# Patient Record
Sex: Male | Born: 2000 | Hispanic: No | Marital: Single
Health system: Southern US, Community
[De-identification: ages and names within clinical notes are randomized; demographics above are authoritative.]

---
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
## 2020-04-07 ENCOUNTER — Emergency Department (HOSPITAL_COMMUNITY)
Admission: EM | Admit: 2020-04-07 | Discharge: 2020-04-07 | Disposition: A | Discharge status: Home / Self Care | Payer: Medicaid Other | Attending: Emergency Medicine | Admitting: Emergency Medicine

## 2020-04-07 ENCOUNTER — Other Ambulatory Visit: Payer: Self-pay

## 2020-04-07 ENCOUNTER — Encounter (HOSPITAL_COMMUNITY): Payer: Self-pay | Admitting: Emergency Medicine

## 2020-04-07 ENCOUNTER — Emergency Department (HOSPITAL_COMMUNITY): Payer: Medicaid Other

## 2020-04-07 DIAGNOSIS — Y999 Unspecified external cause status: Secondary | ICD-10-CM | POA: Insufficient documentation

## 2020-04-07 DIAGNOSIS — W25XXXA Contact with sharp glass, initial encounter: Secondary | ICD-10-CM | POA: Insufficient documentation

## 2020-04-07 DIAGNOSIS — S91125A Laceration with foreign body of left lesser toe(s) without damage to nail, initial encounter: Secondary | ICD-10-CM

## 2020-04-07 DIAGNOSIS — Y929 Unspecified place or not applicable: Secondary | ICD-10-CM | POA: Insufficient documentation

## 2020-04-07 DIAGNOSIS — S91115A Laceration without foreign body of left lesser toe(s) without damage to nail, initial encounter: Secondary | ICD-10-CM | POA: Diagnosis not present

## 2020-04-07 DIAGNOSIS — Y939 Activity, unspecified: Secondary | ICD-10-CM | POA: Insufficient documentation

## 2020-04-07 DIAGNOSIS — S91145A Puncture wound with foreign body of left lesser toe(s) without damage to nail, initial encounter: Secondary | ICD-10-CM | POA: Diagnosis present

## 2020-04-07 DIAGNOSIS — Z79899 Other long term (current) drug therapy: Secondary | ICD-10-CM | POA: Diagnosis not present

## 2020-04-07 MED ORDER — CEPHALEXIN 500 MG PO CAPS: 500.0000 mg | ORAL_CAPSULE | Freq: Four times a day (QID) | ORAL | 0 refills | Status: AC

## 2020-04-07 NOTE — ED Triage Notes (Signed)
Per pt, states he cut his left little toe about 2 weeks ago-sates he has been cleaning it etc-came toED to have it looked at

## 2020-04-07 NOTE — ED Provider Notes (Signed)
[facility]-EMERGENCY DEPT Provider Note   CSN: [id number] Arrival date & time: 04/07/20  1231     History Chief Complaint  Patient presents with  . Extremity Laceration    Derek Nunez is a 19 y.o. male.  HPI Patient is a 19 year old male with no pertinent medical history who presents to the emergency department complaining of a laceration to his left fifth toe that occurred about 2 weeks ago.  Patient states he was outside and believes he cut it on a shard of glass.  He has been cleaning the wound and applying bandaging and came to the emergency department today for evaluation.  Notes mild pain overlying the site but otherwise denies any discharge, increased warmth, fevers, chills, nausea, vomiting.    History reviewed. No pertinent past medical history.  There are no problems to display for this patient.   History reviewed. No pertinent surgical history.     No family history on file.  Social History   Tobacco Use  . Smoking status: Not on file  Substance Use Topics  . Alcohol use: Not Currently  . Drug use: Not Currently    Home Medications Prior to Admission medications   Medication Sig Start Date End Date Taking? Authorizing Provider  HYDROcodone-acetaminophen (HYCET) 7.5-325 mg/15 ml solution 2.5 mls po q4-6h prn pain 12/17/12   Viviano Simas, NP  ibuprofen (ADVIL,MOTRIN) 200 MG tablet Take 200 mg by mouth every 6 (six) hours as needed for pain, fever or headache.    [provider]    Allergies    Patient has no known allergies.  Review of Systems   Review of Systems  Constitutional: Negative for chills and fever.  Gastrointestinal: Negative for nausea and vomiting.  Musculoskeletal: Positive for joint swelling and myalgias.  Skin: Positive for wound.   Physical Exam Updated Vital Signs BP 104/77 (BP Location: Left Arm)   Pulse (!) 111   Temp 99.2 F (37.3 C) (Oral)   Resp 16   Ht 5\' 10"  (1.778 m)   Wt 65.8 kg    SpO2 100%   BMI 20.81 kg/m   Physical Exam Vitals and nursing note reviewed.  Constitutional:      General: He is not in acute distress.    Appearance: Normal appearance. He is not ill-appearing, toxic-appearing or diaphoretic.  HENT:     Head: Normocephalic and atraumatic.     Right Ear: External ear normal.     Left Ear: External ear normal.     Nose: Nose normal.     Mouth/Throat:     Mouth: Mucous membranes are moist.     Pharynx: Oropharynx is clear. No oropharyngeal exudate or posterior oropharyngeal erythema.  Eyes:     Extraocular Movements: Extraocular movements intact.  Cardiovascular:     Rate and Rhythm: Normal rate and regular rhythm.     Pulses: Normal pulses.     Heart sounds: Normal heart sounds. No murmur heard.  No friction rub. No gallop.      Comments: RRR. Pt is not tachycardic on my exam. No M/R/G.  Pulmonary:     Effort: Pulmonary effort is normal. No respiratory distress.     Breath sounds: Normal breath sounds. No stridor. No wheezing, rhonchi or rales.  Abdominal:     General: Abdomen is flat.     Tenderness: There is no abdominal tenderness.  Musculoskeletal:        General: Normal range of motion.     Cervical back: Normal  range of motion and neck supple. No tenderness.  Skin:    General: Skin is warm and dry.     Comments: 2 to 3 cm well-healing laceration noted to the left fifth toe.  Patient is able to flex and extend the toe.  Distal sensation intact.  2+ pedal pulses noted bilaterally.  No signs of erythema, fluctuance, induration.  No discharge noted with manipulation of the site.  Neurological:     General: No focal deficit present.     Mental Status: He is alert and oriented to person, place, and time.  Psychiatric:        Mood and Affect: Mood normal.        Behavior: Behavior normal.    ED Results / Procedures / Treatments   Labs (all labs ordered are listed, but only abnormal results are displayed) Labs Reviewed - No data to  display  EKG None  Radiology DG Toe 5th Left  Result Date: 04/07/2020 CLINICAL DATA:  19 year old male status post fall 2 weeks ago with nonhealing left 5th toe. EXAM: DG TOE 5TH LEFT COMPARISON:  None. FINDINGS: Bone mineralization is within normal limits. Underlying osseous structures appear intact and aligned. Normal joint spaces in the left 5th toe. There are 2 vague radiopaque densities on the initial image lateral to the 5th middle phalanx (perhaps skin irregularity), and the second near the lateral nail bed. Of these, the latter persists on all images, and measures 1-2 mm. IMPRESSION: 1. Evidence of a tiny 1-2 mm retained radiopaque foreign body along the lateral tip of the 5th toe near the nail bed (arrows). 2. No underlying osseous abnormality. Electronically Signed   By: Odessa Fleming M.D.   On: 04/07/2020 13:51    Procedures Procedures (including critical care time)  Medications Ordered in ED Medications - No data to display  ED Course  I have reviewed the triage vital signs and the nursing notes.  Pertinent labs & imaging results that were available during my care of the patient were reviewed by me and considered in my medical decision making (see chart for details).  Clinical Course as of Apr 07 1434  Thu Apr 07, 2020  1431 1. Evidence of a tiny 1-2 mm retained radiopaque foreign body along the lateral tip of the 5th toe near the nail bed (arrows). 2. No underlying osseous abnormality.  DG Toe 5th Left [LJ]    Clinical Course User Index [LJ] Placido Sou, PA-C   MDM Rules/Calculators/A&P                          Pt is a 19 y.o. male that presents with a history, physical exam, and ED Clinical Course as noted above.   Patient presents today for follow-up regarding a laceration to his left fifth toe that occurred 2 weeks ago.  Physical exam is extremely reassuring.  No signs or symptoms of infection at this time.  Recommended updating the patient's Tdap but he declined at  this visit. Discussed this with pt but he does not want to move forward at this time.   Given the nature of the cut, will discharge on a short course of Keflex to help prevent infection.  There is a 1 to 2 mm retained foreign body but with it being 2 weeks since the laceration and his toe healing well with minimal tenderness, I am not going to incise the toe at this time.  Will give follow-up with orthopedic  surgery.  Patient is hemodynamically stable and in NAD at the time of d/c. Evaluation does not show pathology that would require ongoing emergent intervention or inpatient treatment. I explained the diagnosis to the patient. Patient is comfortable with above plan and is stable for discharge at this time. All questions were answered prior to disposition. Strict return precautions for returning to the ED were discussed. Encouraged follow up with PCP.    An After Visit Summary was printed and given to the patient.  Patient discharged to home/self care.  Condition at discharge: Stable  Note: Portions of this report may have been transcribed using voice recognition software. Every effort was made to ensure accuracy; however, inadvertent computerized transcription errors may be present.   Final Clinical Impression(s) / ED Diagnoses Final diagnoses:  Laceration of lesser toe of left foot with foreign body without damage to nail, initial encounter    Rx / DC Orders ED Discharge Orders         Ordered    cephALEXin (KEFLEX) 500 MG capsule  4 times daily        04/07/20 1439           Placido Sou, PA-C 04/07/20 1441    Tilden Fossa, MD 04/07/20 1730

## 2020-04-07 NOTE — Discharge Instructions (Signed)
I am prescribing you an antibiotic called Keflex.  You are going to take this 4 times a day for the next 5 days.  Please do not stop taking this early.  I would recommend taking Tylenol and ibuprofen for management of your pain.  Please follow-up with the orthopedist, whose contact information is below.  They can further evaluate your toe and determine if the foreign body in your toe needs to be removed.  You can always return to the emergency department with new or worsening symptoms.  It was a pleasure to meet you.

## 2021-10-29 IMAGING — CR DG TOE 5TH 2+V*L*
4 series · 4 of 4 positions shown · non-contrast
Comparison: None.

CLINICAL DATA: 19-year-old male status post fall 2 weeks ago with
nonhealing left 5th toe.

EXAM:
DG TOE 5TH LEFT

[x toes ap left]
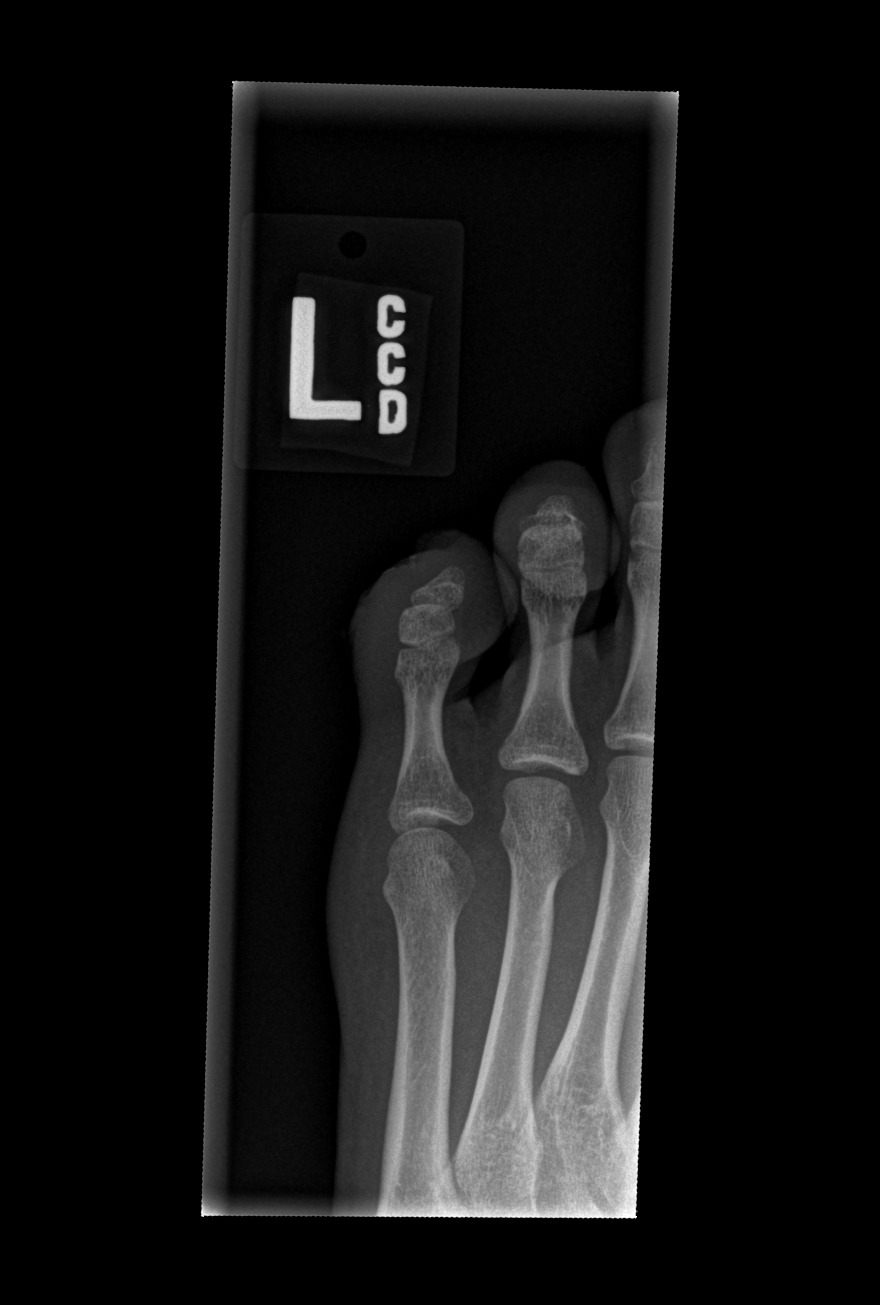

[x toes obl left (1 of 2)]
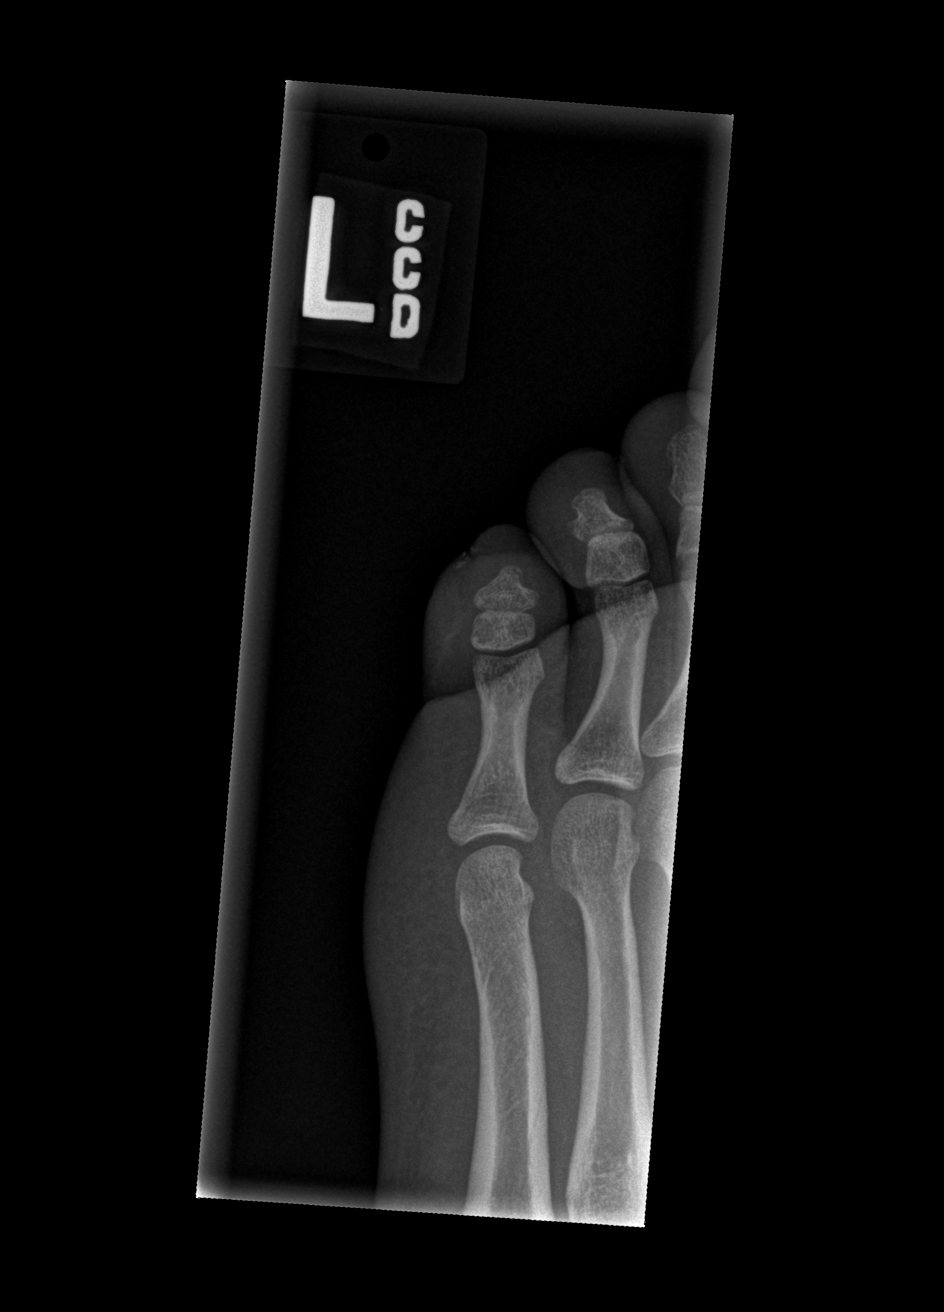

[x toes obl left (2 of 2)]
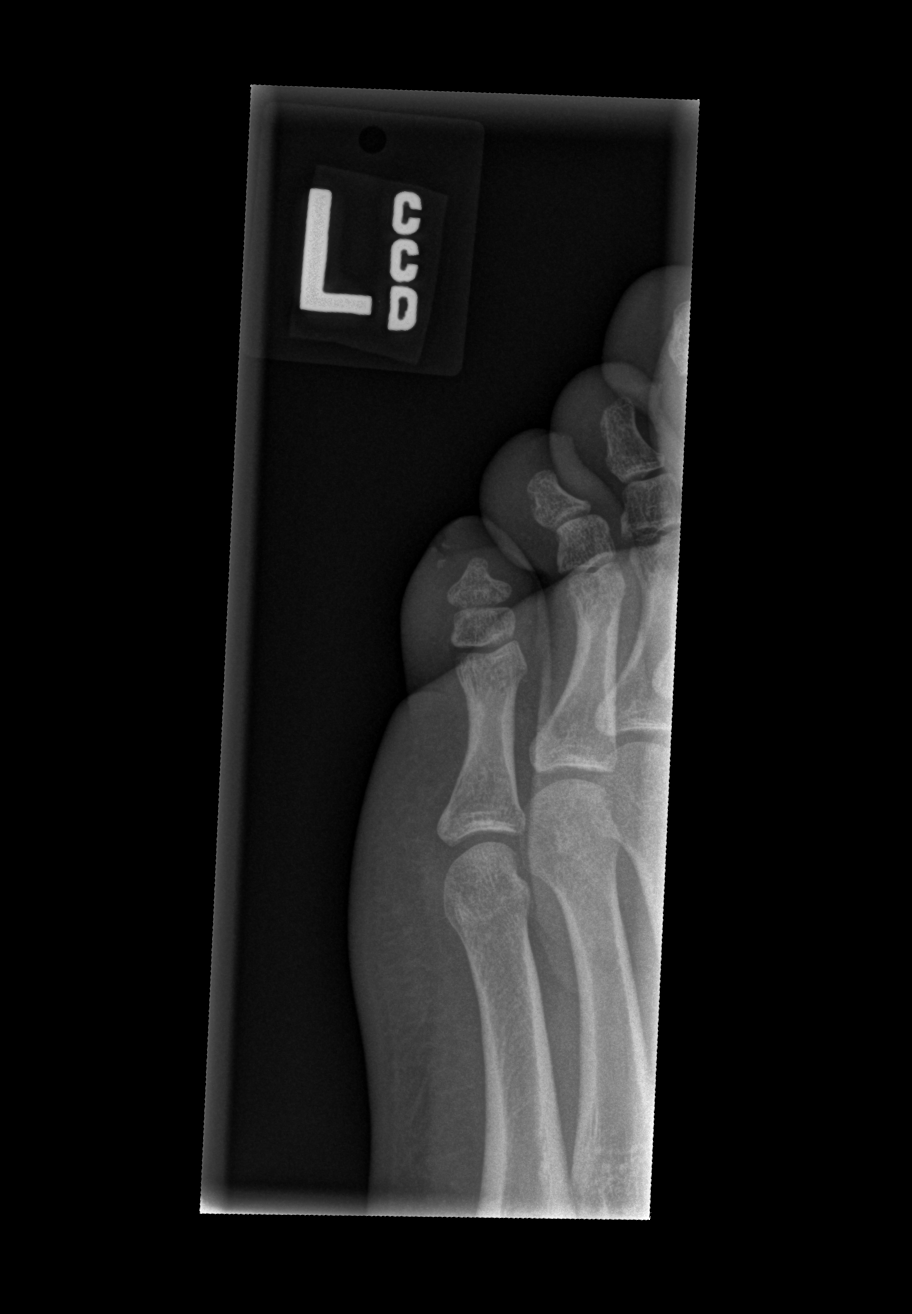

[x toes lat left]
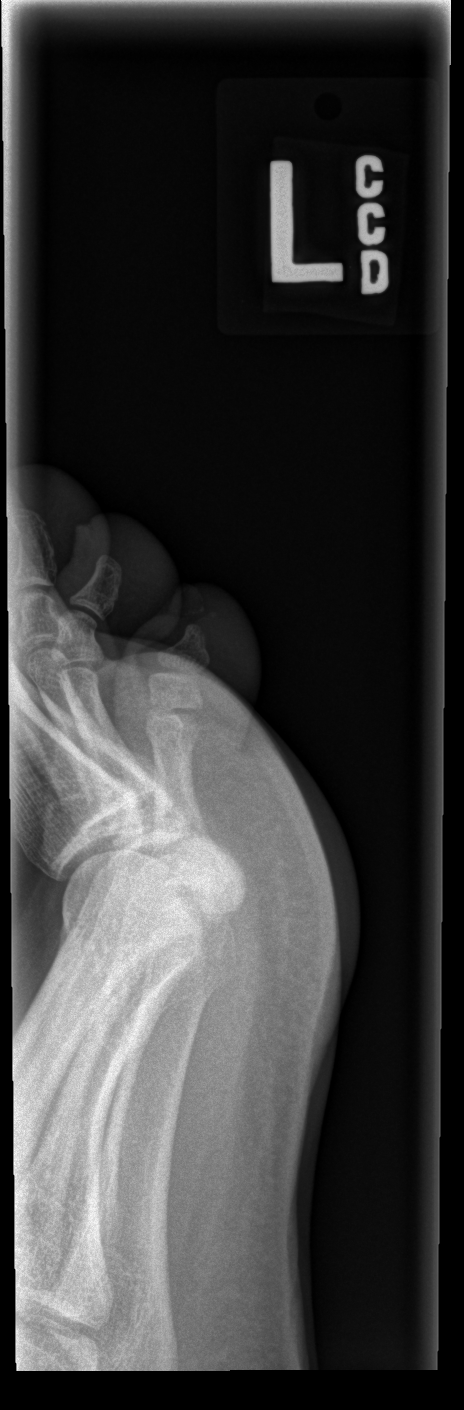

[4 of 4 positions shown; findings below may reference images not displayed]

FINDINGS: Bone mineralization is within normal limits. Underlying osseous
structures appear intact and aligned. Normal joint spaces in the
left 5th toe. There are 2 vague radiopaque densities on the initial
image lateral to the 5th middle phalanx (perhaps skin irregularity),
and the second near the lateral nail bed. Of these, the latter
persists on all images, and measures 1-2 mm.
IMPRESSION: 1. Evidence of a tiny 1-2 mm retained radiopaque foreign body along
the lateral tip of the 5th toe near the nail bed (arrows).
2. No underlying osseous abnormality.

## 2022-01-13 ENCOUNTER — Emergency Department (HOSPITAL_COMMUNITY)
Admission: EM | Admit: 2022-01-13 | Discharge: 2022-01-13 | Disposition: A | Discharge status: Home / Self Care | Payer: Medicaid Other | Attending: Emergency Medicine | Admitting: Emergency Medicine

## 2022-01-13 ENCOUNTER — Other Ambulatory Visit: Payer: Self-pay

## 2022-01-13 ENCOUNTER — Encounter (HOSPITAL_COMMUNITY): Payer: Self-pay | Admitting: Emergency Medicine

## 2022-01-13 DIAGNOSIS — S81851A Open bite, right lower leg, initial encounter: Secondary | ICD-10-CM | POA: Diagnosis present

## 2022-01-13 DIAGNOSIS — Y990 Civilian activity done for income or pay: Secondary | ICD-10-CM | POA: Diagnosis not present

## 2022-01-13 DIAGNOSIS — S81811A Laceration without foreign body, right lower leg, initial encounter: Secondary | ICD-10-CM | POA: Insufficient documentation

## 2022-01-13 DIAGNOSIS — W540XXA Bitten by dog, initial encounter: Secondary | ICD-10-CM | POA: Insufficient documentation

## 2022-01-13 DIAGNOSIS — S70312A Abrasion, left thigh, initial encounter: Secondary | ICD-10-CM | POA: Insufficient documentation

## 2022-01-13 DIAGNOSIS — Y9389 Activity, other specified: Secondary | ICD-10-CM | POA: Insufficient documentation

## 2022-01-13 MED ORDER — AMOXICILLIN-POT CLAVULANATE 875-125 MG PO TABS: 1.0000 | ORAL_TABLET | Freq: Two times a day (BID) | ORAL | 0 refills | Status: AC

## 2022-01-13 NOTE — ED Provider Notes (Signed)
[facility]-EMERGENCY DEPT Provider Note   CSN: [id number] Arrival date & time: 01/13/22  1631     History  Chief Complaint  Patient presents with   Animal Bite    Derek Nunez is a 21 y.o. male with chief complaint of dog bites to both legs 1 hour ago.  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  Does not know the vaccination status of the of the dog/dogs.  States the dogs did not appear rabid, just appeared to have escaped the invisible fence and bit him.  Unknown Tdap status.  Has never received rabies vaccination.  The history is provided by the patient and medical records.  Animal Bite      Home Medications Prior to Admission medications   Medication Sig Start Date End Date Taking? Authorizing Provider  amoxicillin-clavulanate (AUGMENTIN) 875-125 MG tablet Take 1 tablet by mouth every 12 (twelve) hours for 7 days. 01/13/22 01/20/22 Yes Cecil Cobbs, PA-C  ibuprofen (ADVIL,MOTRIN) 200 MG tablet Take 200 mg by mouth every 6 (six) hours as needed for pain, fever or headache.    [provider]      Allergies    Patient has no known allergies.    Review of Systems   Review of Systems  Skin:  Positive for wound.    Physical Exam Updated Vital Signs BP 134/90 (BP Location: Left Arm)   Pulse 60   Temp 98.5 F (36.9 C) (Oral)   Resp 16   SpO2 98%  Physical Exam Vitals and nursing note reviewed.  Constitutional:      General: He is not in acute distress.    Appearance: Normal appearance. He is well-developed. He is not ill-appearing or diaphoretic.  HENT:     Head: Normocephalic and atraumatic.  Eyes:     Conjunctiva/sclera: Conjunctivae normal.  Cardiovascular:     Rate and Rhythm: Normal rate and regular rhythm.     Pulses: Normal pulses.     Heart sounds: No murmur heard. Pulmonary:     Effort: Pulmonary effort is normal. No respiratory distress.     Breath sounds: Normal breath  sounds.  Abdominal:     Palpations: Abdomen is soft.     Tenderness: There is no abdominal tenderness.  Musculoskeletal:        General: No swelling.     Cervical back: Neck supple.  Skin:    General: Skin is warm and dry.     Capillary Refill: Capillary refill takes less than 2 seconds.     Findings: Wound present.          Comments:  2 lacerations to the right medial aspect of the lower leg.  Minimal dried blood appreciated, without active hemorrhage.  Each laceration appears shallow, about 1 mm deep, and about 1 cm X 1cm in diameter. 1 abrasion to the posterior aspect of the left upper thigh.  No evidence of bleeding or discharge.  Skin break appears superficial.  Neurological:     Mental Status: He is alert and oriented to person, place, and time.  Psychiatric:        Mood and Affect: Mood normal.     ED Results / Procedures / Treatments   Labs (all labs ordered are listed, but only abnormal results are displayed) Labs Reviewed - No data to display  EKG None  Radiology No results found.  Procedures Procedures    Medications Ordered in ED Medications -  No data to display  ED Course/ Medical Decision Making/ A&P                           Medical Decision Making Amount and/or Complexity of Data Reviewed External Data Reviewed: notes. Labs: ordered. Decision-making details documented in ED Course. Radiology: ordered and independent interpretation performed. Decision-making details documented in ED Course. ECG/medicine tests: ordered and independent interpretation performed. Decision-making details documented in ED Course.  Risk OTC drugs. Prescription drug management.   21 y.o. male presents to the ED for concern of Animal Bite   This involves an extensive number of treatment options, and is a complaint that carries with it a high risk of complications and morbidity.    Past Medical History / Co-morbidities / Social History: None  Physical  Exam: Physical exam performed. The pertinent findings include: 2 lacerations to the left anterior aspect of the left lower extremity, and 1 very superficial abrasion to the right posterior thigh.  Lab Tests: None  Imaging Studies: None  Medications: I have reviewed the patients home medicines and have made adjustments as needed  ED Course/Disposition: Pt well-appearing on exam.  Presents with 2 lacerations and an abrasion from a dog bite incident that occurred 1 hour ago.  Wounds are superficial overall.   Pt wounds irrigated well with 18ga angiocath with sterile saline.  Wounds examined with visualization of the base and no foreign bodies seen.  Pt Alert and oriented, NAD, nontoxic, nonseptic appearing.  Capillary refill intact and pt without neurologic deficit.  Due to the superficial nature of the wounds and lack of bony tenderness, I feel x-ray imaging may be deferred at this time.  Offered Tdap booster, but the patient declined.  Patient rabies vaccine and immunoglobulin risk and benefit discussed.  Pt declines this as well.  Further pamphlet information provided on Tdap and rabies vaccination series per request of patient.  Patient would like to follow-up with the homeowners to find out whether the dog/dogs were vaccinated for rabies before considering proceeding.  Wounds not closed secondary to concern for infection.  Plan to discharge home with Augmentin.  Recommend conservative symptom management and close follow-up with PCP.  Patient in NAD in good condition at time of discharge.  After consideration of the diagnostic results and the patient's encounter today, I feel that the emergency department workup does not suggest an emergent condition requiring admission or immediate intervention beyond what has been performed at this time.  The patient is safe for discharge and has been instructed to return immediately for worsening symptoms, change in symptoms or any other concerns.  Discussed  course of treatment thoroughly with the patient, whom demonstrated understanding.  Patient in agreement and has no further questions.  I discussed this case with my attending physician Dr. Rush Landmark, who agreed with the proposed treatment course and cosigned this note including patient's presenting symptoms, physical exam, and planned diagnostics and interventions.  Attending physician stated agreement with plan or made changes to plan which were implemented.     This chart was dictated using voice recognition software.  Despite best efforts to proofread, errors can occur which can change the documentation meaning.         Final Clinical Impression(s) / ED Diagnoses Final diagnoses:  Dog bite, initial encounter    Rx / DC Orders ED Discharge Orders          Ordered    amoxicillin-clavulanate (AUGMENTIN) 875-125 MG tablet  Every 12 hours        01/13/22 1739              Sandrea Hammond 01/13/22 1745    Tegeler, Canary Brim, MD 01/13/22 2232

## 2022-01-13 NOTE — ED Triage Notes (Signed)
Patient reports dog bites to bilateral legs at work today. States unsure vaccine status of dogs.

## 2022-01-13 NOTE — ED Notes (Addendum)
Pt given CDC information sheets on rabies vaccine and tdap vaccine. Showed pt on the information sheet the website to get to further information. All of patients questions answered about the vaccines. Pt wound cleaned with NS and bandaid applied.

## 2022-01-13 NOTE — Discharge Instructions (Addendum)
An antibiotic by the name of Augmentin has been sent to your pharmacy.  Please take every 12 hours (twice per day) for the next 7 days to prevent/treat infection.  Always take with plenty of food and water.  Follow-up with your PCP within the next 3 to 4 days for reevaluation and continued medical management.  If you choose, you may return to the ED, urgent care, or your primary care to proceed with Tdap booster and/or rabies vaccination series.  Continue to manage symptoms at home with ibuprofen and Tylenol as needed.  Return to the ED for new or worsening symptoms as discussed.
